# Patient Record
Sex: Male | Born: 1965 | Race: White | Hispanic: No | Marital: Married | Smoking: Never smoker
Health system: Southern US, Community
[De-identification: ages and names within clinical notes are randomized; demographics above are authoritative.]

## PROBLEM LIST (undated history)

## (undated) DIAGNOSIS — N200 Calculus of kidney: Secondary | ICD-10-CM

## (undated) HISTORY — PX: APPENDECTOMY: SHX54

---

## 2008-10-31 ENCOUNTER — Encounter (INDEPENDENT_AMBULATORY_CARE_PROVIDER_SITE_OTHER): Payer: Self-pay | Admitting: General Surgery

## 2008-11-01 ENCOUNTER — Inpatient Hospital Stay (HOSPITAL_COMMUNITY): Admission: EM | Admit: 2008-11-01 | Discharge: 2008-11-04 | Payer: Self-pay | Admitting: Emergency Medicine

## 2008-11-12 ENCOUNTER — Inpatient Hospital Stay (HOSPITAL_COMMUNITY): Admission: EM | Admit: 2008-11-12 | Discharge: 2008-11-15 | Payer: Self-pay | Admitting: Emergency Medicine

## 2008-11-13 ENCOUNTER — Ambulatory Visit (HOSPITAL_COMMUNITY): Admission: RE | Admit: 2008-11-13 | Discharge: 2008-11-13 | Payer: Self-pay | Admitting: General Surgery

## 2009-08-06 ENCOUNTER — Observation Stay (HOSPITAL_COMMUNITY): Admission: EM | Admit: 2009-08-06 | Discharge: 2009-08-07 | Payer: Self-pay | Admitting: Emergency Medicine

## 2010-04-19 IMAGING — US US SCROTUM
1 series · 14 of 25 positions shown · non-contrast
Comparison: None.

CLINICAL DATA: Right scrotal pain

SCROTAL ULTRASOUND
DOPPLER ULTRASOUND OF THE TESTICLES
TECHNIQUE: Complete ultrasound examination of the testicles,
epididymis, and other scrotal structures was performed.  Color and
spectral Doppler ultrasound were also utilized to evaluate blood
flow to the testicles.

[Series 1: unknown · 0.09mm/px · 14 of 42 slices shown]
[im 1/42]
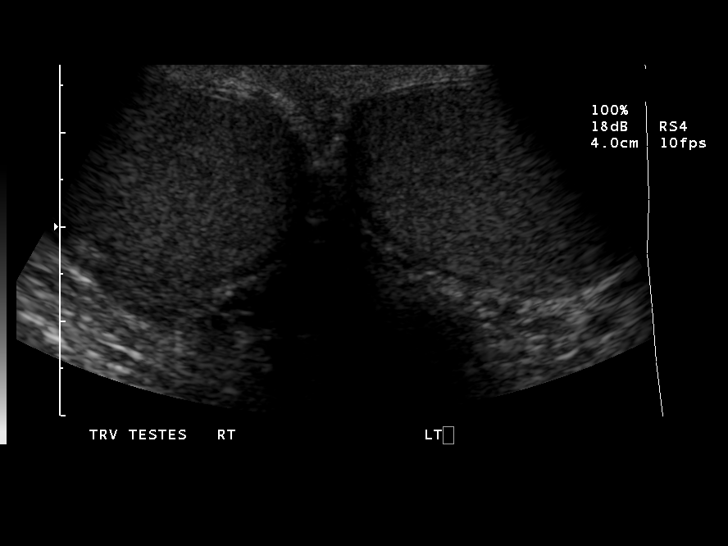
[im 4/42]
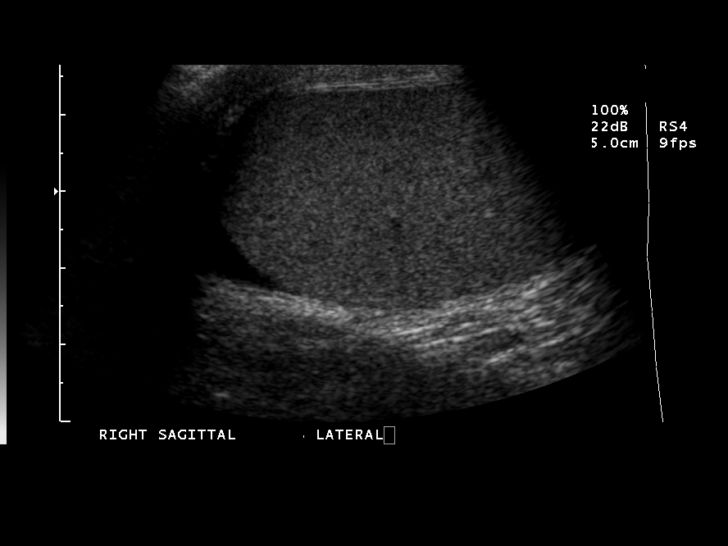
[im 7/42]
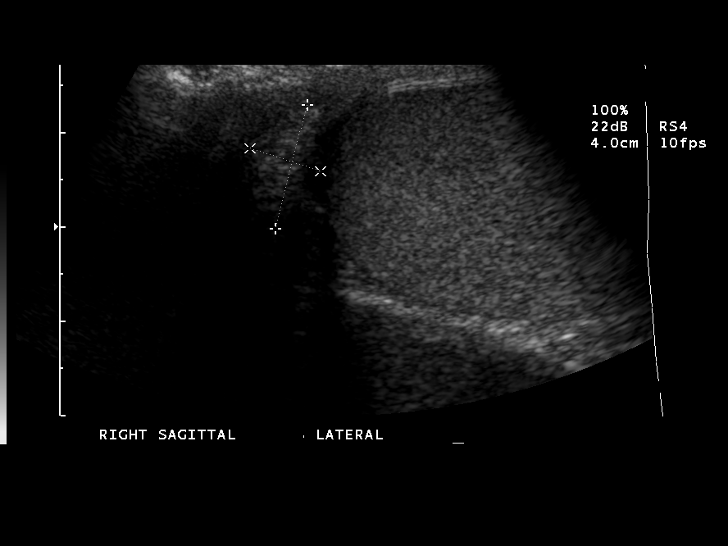
[im 11/42]
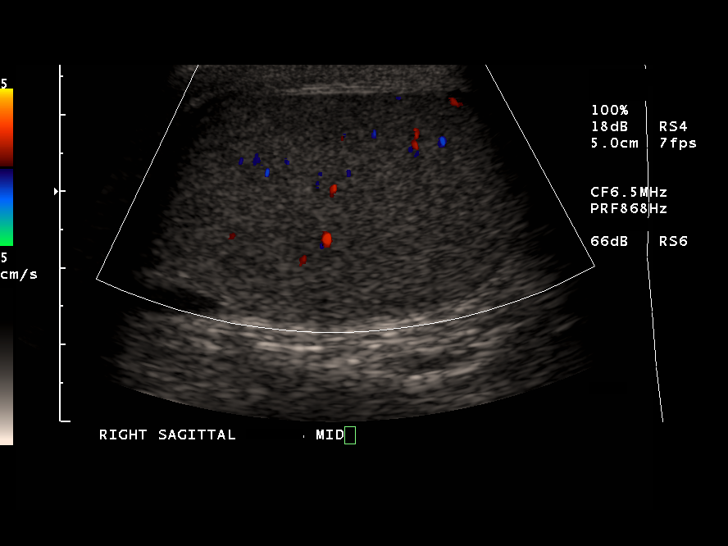
[im 14/42]
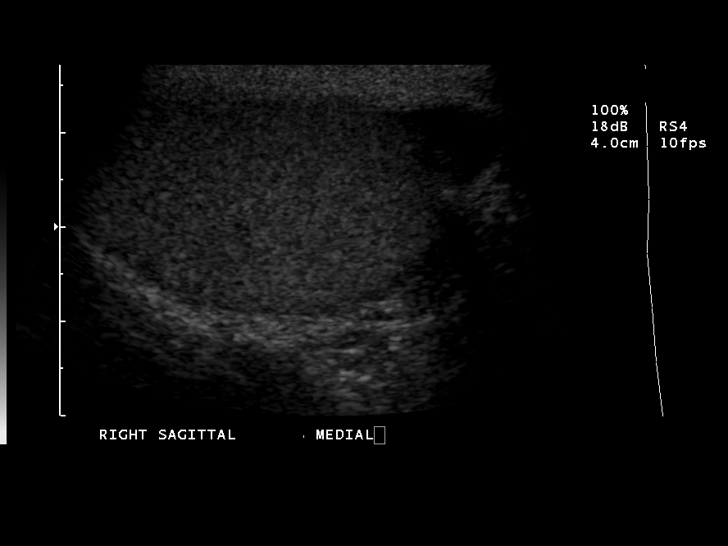
[im 16/42]
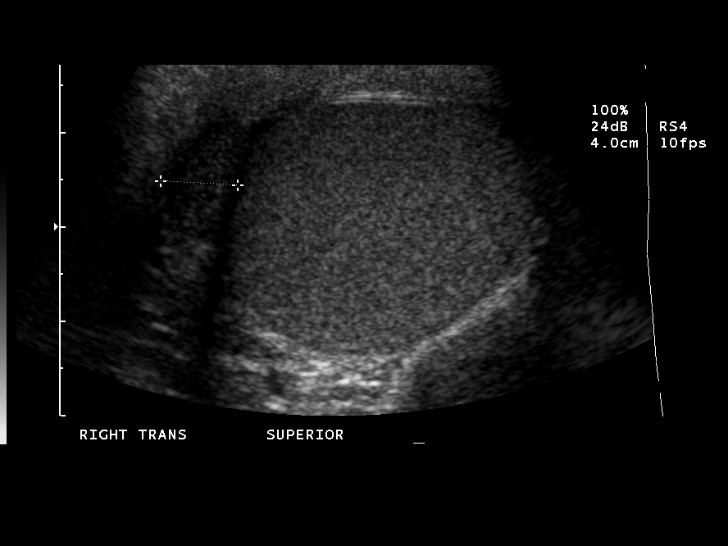
[im 19/42]
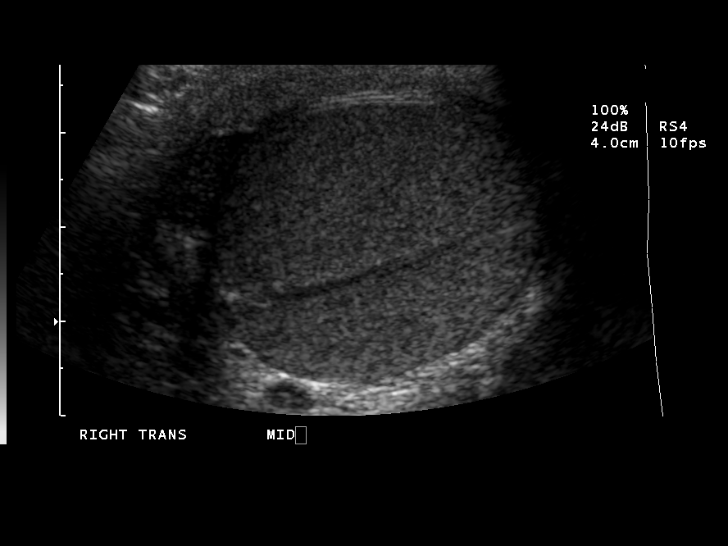
[im 23/42]
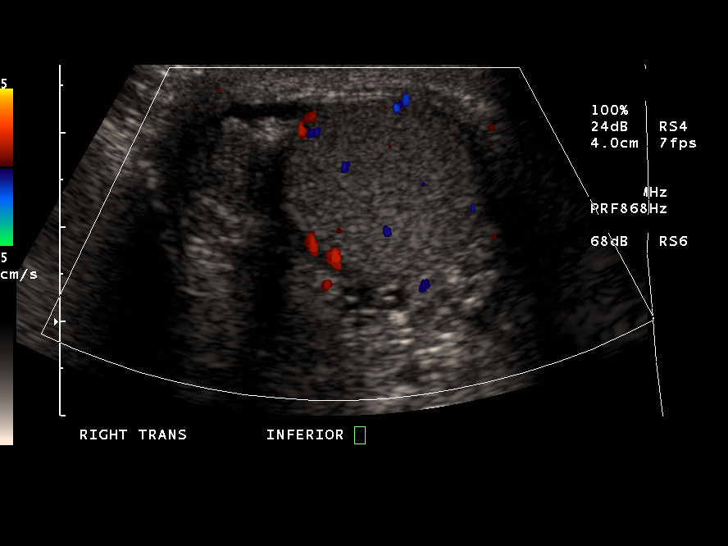
[im 26/42]
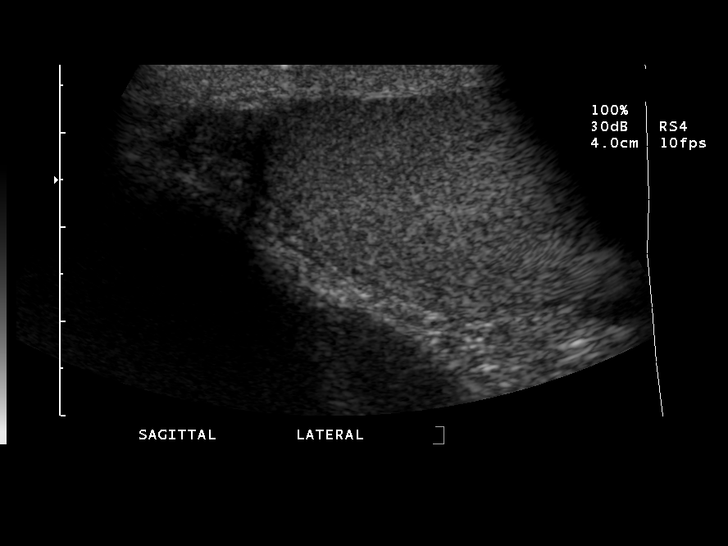
[im 28/42]
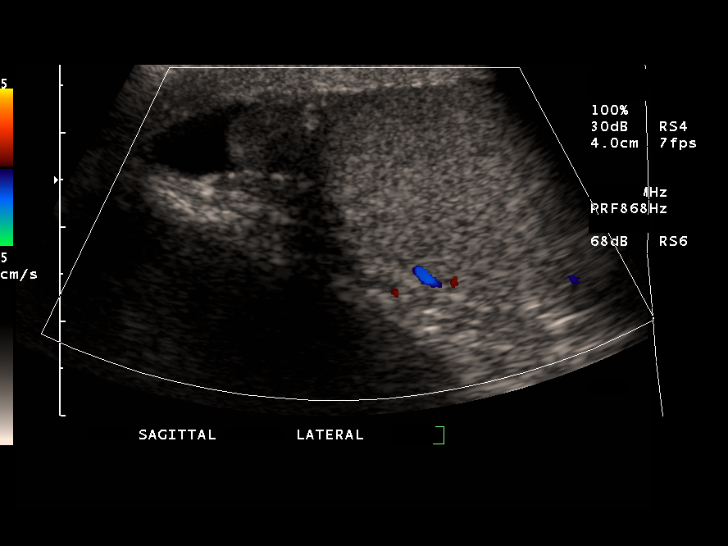
[im 31/42]
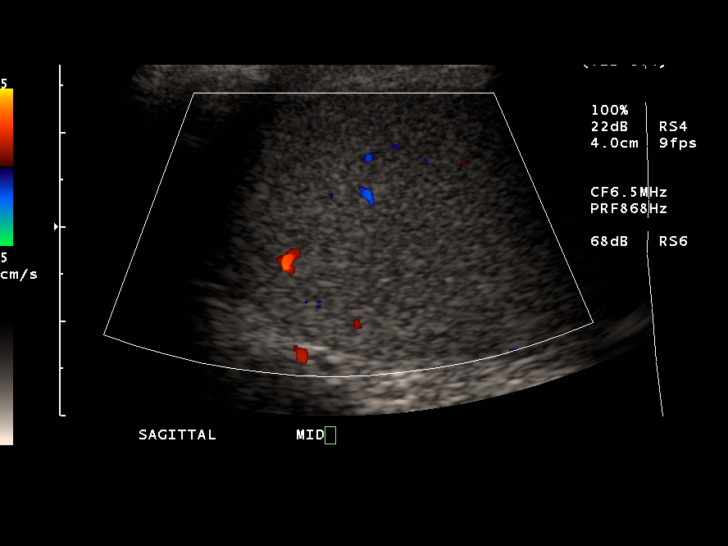
[im 35/42]
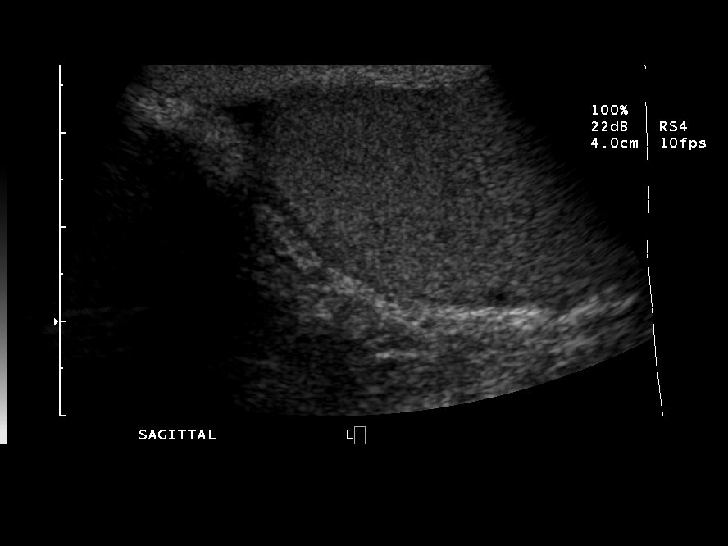
[im 38/42]
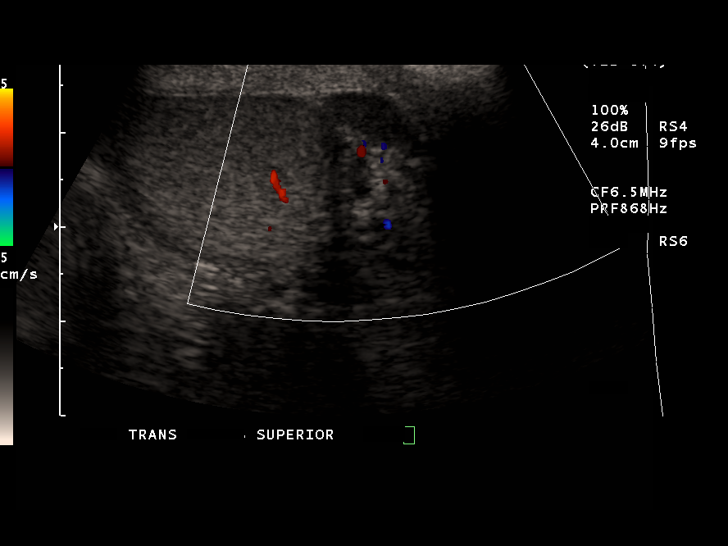
[im 42/42]
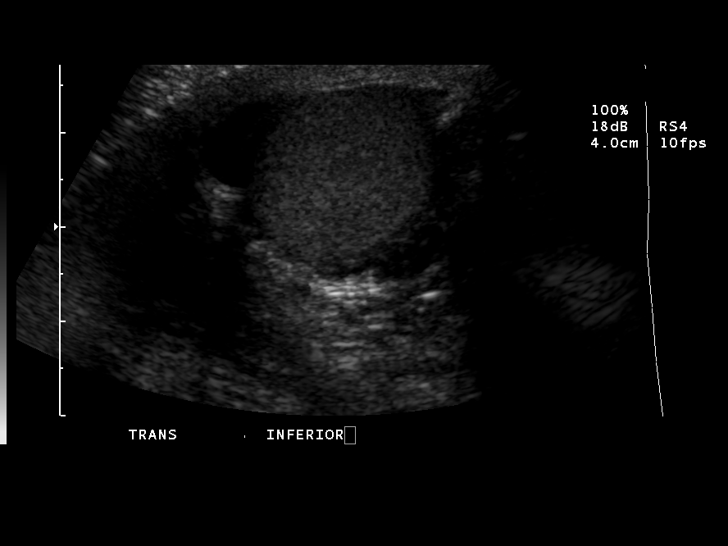

[14 of 25 positions shown; findings below may reference images not displayed]

FINDINGS: Right testis 5.1 x 2.9 x 3.6 cm.
Left testis 4.7 x 2.8 x 3.5 cm.
Both testes are normal in appearance, homogeneous in echogenicity,
symmetric.
No intratesticular mass, calcification or cyst.
Minimal asymmetry of epididymi, slightly larger on left, without
focal masses.
Tiny left hydrocele.
No varicocele or inguinal hernia identified.
Symmetric blood flow is seen within epididymi and testes on color
Doppler imaging.
Arterial and venous waveforms present both testes on pulse wave
Doppler.
No evidence of testicular torsion or hyperemia.
IMPRESSION: Tiny left hydrocele.
Otherwise normal exam.

## 2010-04-19 IMAGING — CT CT ABDOMEN W/O CM
2 of 4 series · 16 of 46 positions shown, 18 images · non-contrast
Comparison: 11/12/2008.

CT ABDOMEN

CLINICAL DATA: Right flank pain.  Appendectomy.

CT ABDOMEN AND PELVIS WITHOUT CONTRAST
TECHNIQUE: Multidetector CT imaging of the abdomen and pelvis was
performed following the standard protocol without intravenous
contrast.

[Series 2: standard/full over (age)lbs 5.0 · axial · 0.68mm/px · z∈[-574,-164]mm · 13 of 90 slices shown, 15 images]
[im 4/90  soft-tissue]
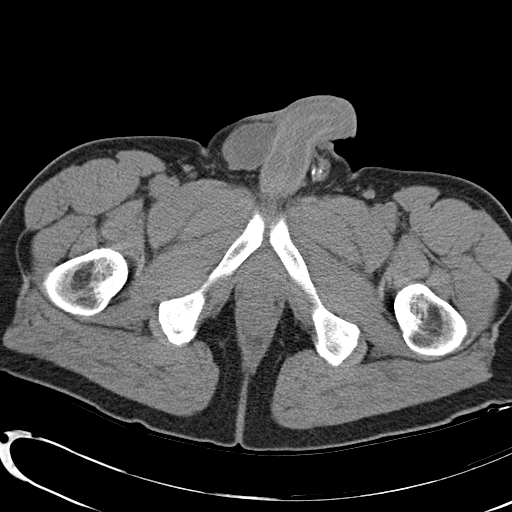
[im 4/90  bone]
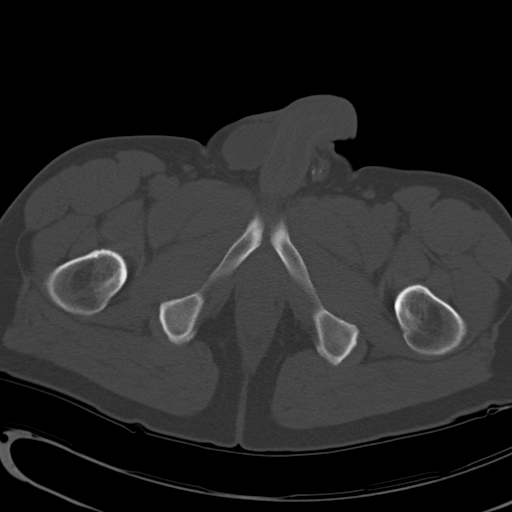
[im 11/90  soft-tissue]
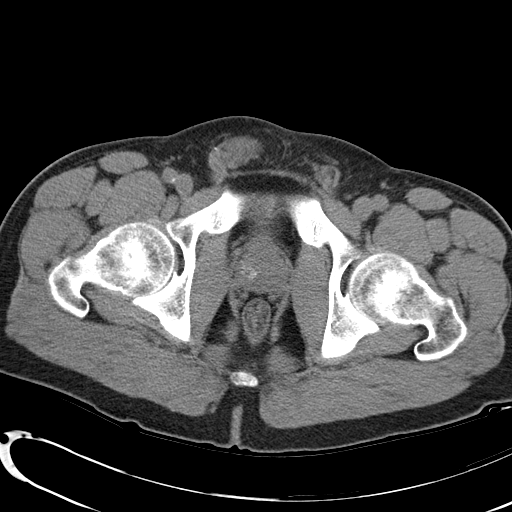
[im 18/90  soft-tissue]
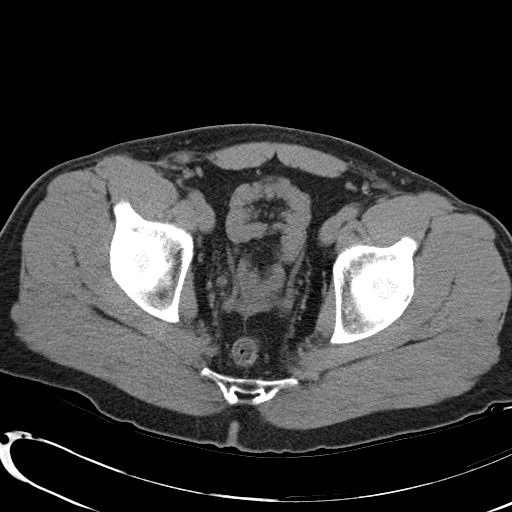
[im 25/90  soft-tissue]
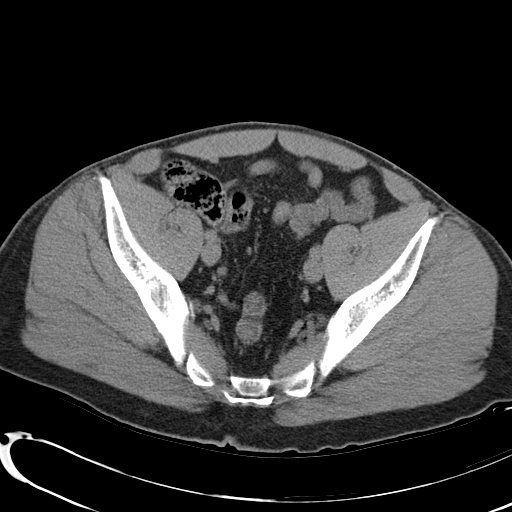
[im 33/90  soft-tissue]
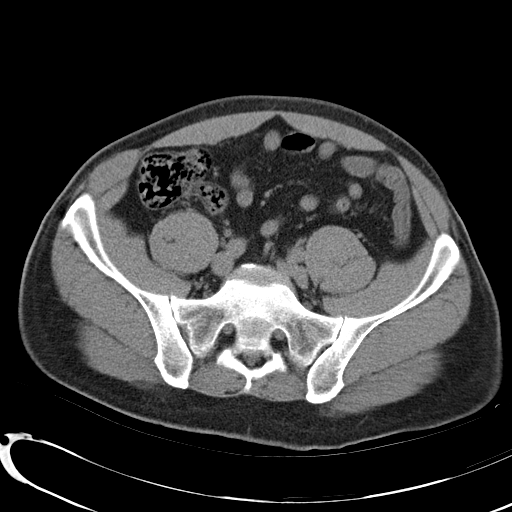
[im 40/90  soft-tissue]
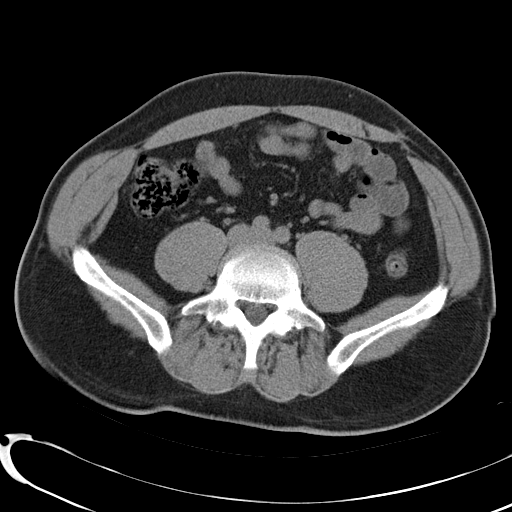
[im 47/90  soft-tissue]
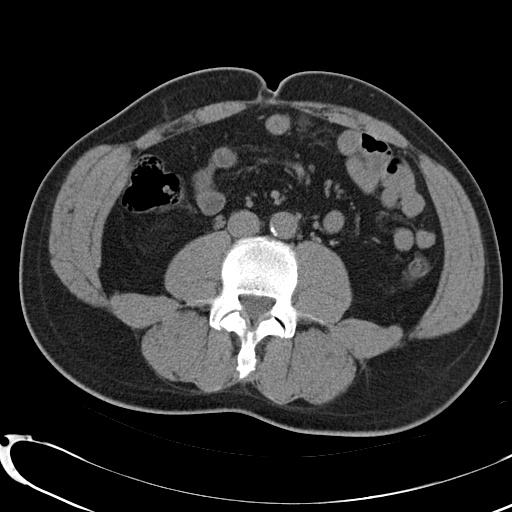
[im 50/90  soft-tissue]
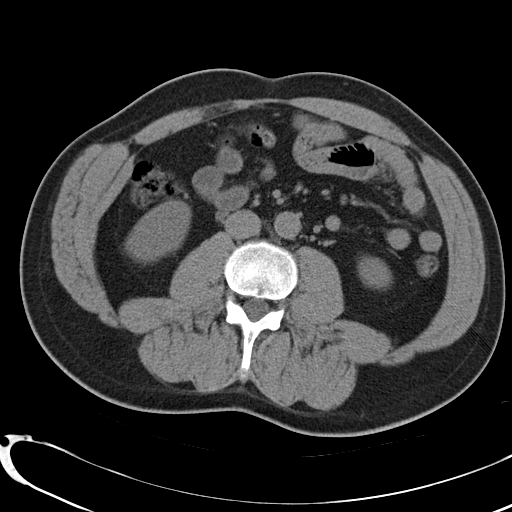
[im 57/90  soft-tissue]
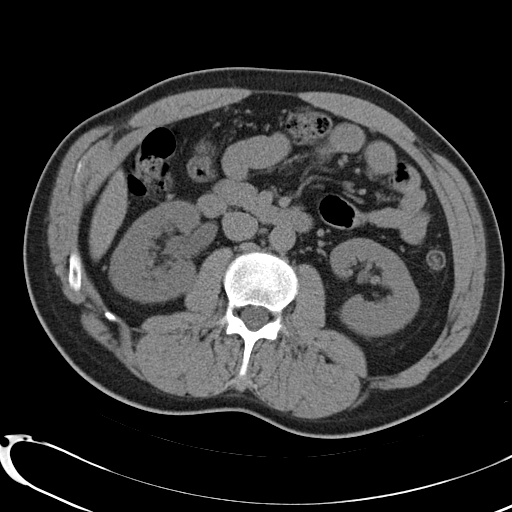
[im 57/90  bone]
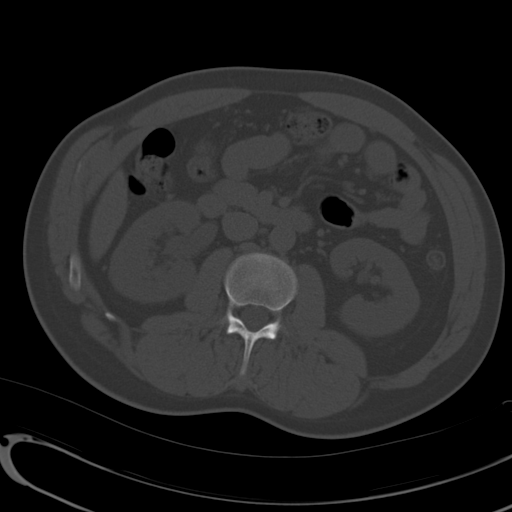
[im 65/90  soft-tissue]
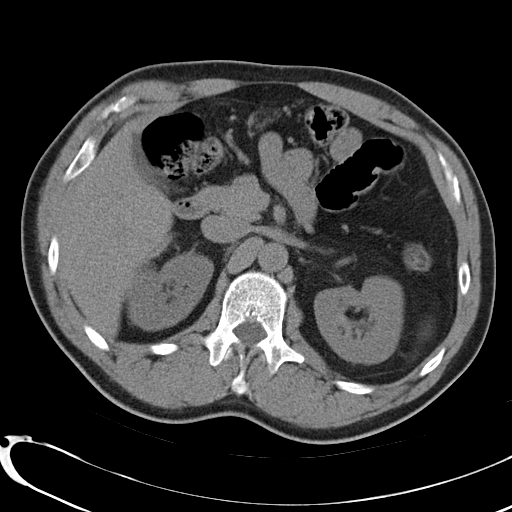
[im 72/90  soft-tissue]
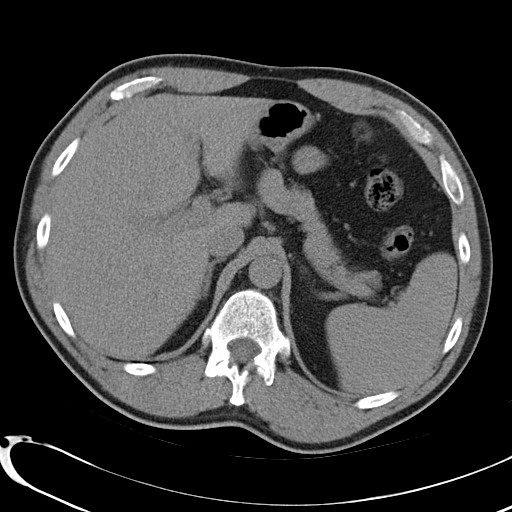
[im 79/90  soft-tissue]
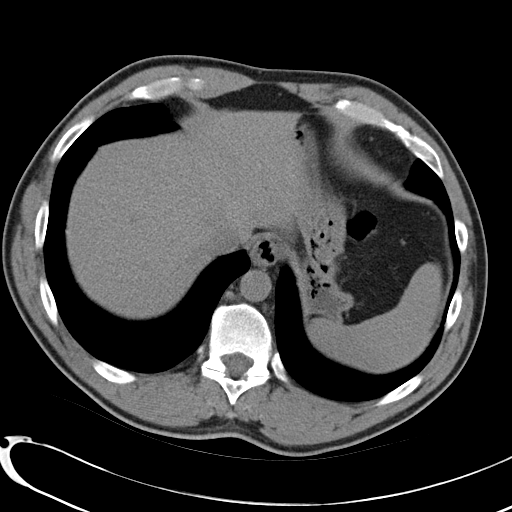
[im 86/90  soft-tissue]
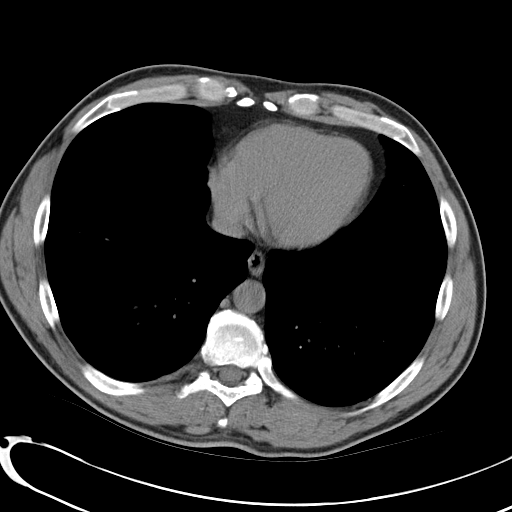

[Series 4: mpr coronal · coronal · 0.66mm/px · 3 of 70 slices shown]
[im 24/70  soft-tissue]
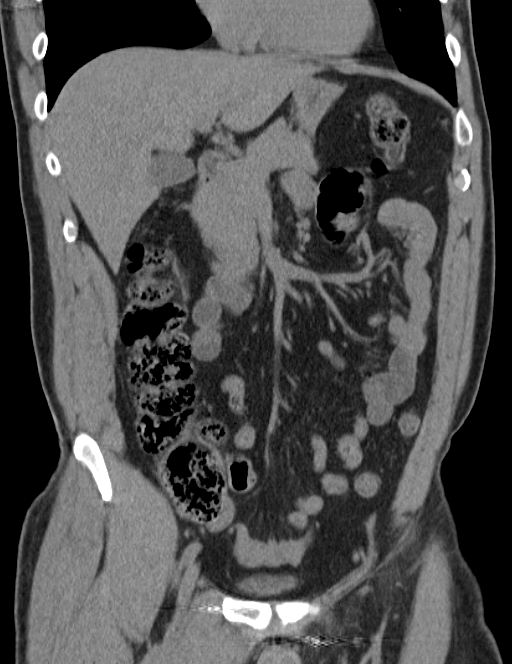
[im 31/70  soft-tissue]
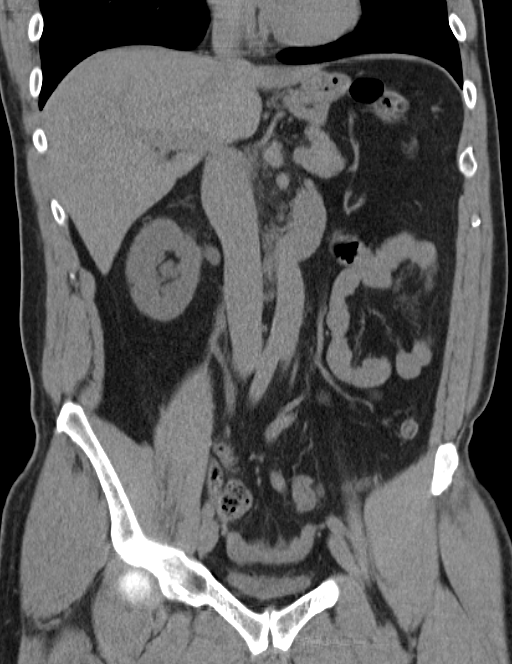
[im 39/70  soft-tissue]
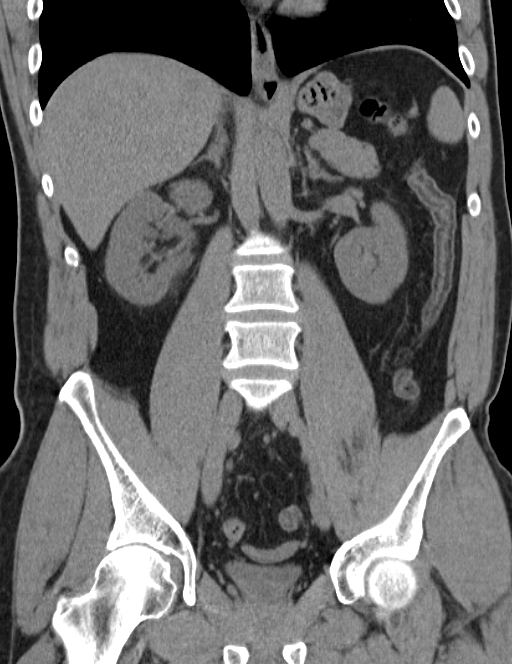

[16 of 46 positions shown; findings below may reference images not displayed]

FINDINGS: Mild   hydronephrosis on the right due to a distal right
ureteral calculus.  The right ureter is diffusely dilated.  There
is a nonobstructing 4 mm stone in the right upper pole.  Negative
for calculi or obstruction on the left.  Negative for renal mass.

There is no mass or adenopathy.  The bowel is normal.
IMPRESSION: Obstruction of the right kidney by a distal right ureteral stone.
There is also a nonobstructing stone in the right upper pole.

CT PELVIS
FINDINGS: 2 x  4 mm stone in the distal right ureter causing
obstruction of the right ureter.  This is very near the UVJ.  No
bladder calculi.  There are prostate calcifications.  There are
clips in the region of prior appendectomy.  There is no bowel
obstruction.  No free fluid.

There are clips in the right groin from prior hernia repair.  There
is a fluid collection in the right upper scrotum measuring 3.2 x
4.2 cm which is likely a hydrocele or lymphocele.
IMPRESSION: 2 x 4 mm calculus in the distal right ureter causing obstruction.

Right upper scrotal fluid collection which may be a lymphocele or
hydrocele.  Prior herniorrhaphy on the right.

## 2010-11-15 LAB — CBC
Platelets: 183 10*3/uL (ref 150–400)
RBC: 5.26 MIL/uL (ref 4.22–5.81)
WBC: 11.4 10*3/uL — ABNORMAL HIGH (ref 4.0–10.5)

## 2010-11-15 LAB — URINALYSIS, ROUTINE W REFLEX MICROSCOPIC
Glucose, UA: NEGATIVE mg/dL
Leukocytes, UA: NEGATIVE
Specific Gravity, Urine: 1.03 (ref 1.005–1.030)
Urobilinogen, UA: 0.2 mg/dL (ref 0.0–1.0)

## 2010-11-15 LAB — DIFFERENTIAL
Basophils Relative: 0 % (ref 0–1)
Lymphs Abs: 1.3 10*3/uL (ref 0.7–4.0)
Monocytes Relative: 5 % (ref 3–12)
Neutro Abs: 9.4 10*3/uL — ABNORMAL HIGH (ref 1.7–7.7)
Neutrophils Relative %: 83 % — ABNORMAL HIGH (ref 43–77)

## 2010-11-15 LAB — BASIC METABOLIC PANEL
Calcium: 9.1 mg/dL (ref 8.4–10.5)
Creatinine, Ser: 1.06 mg/dL (ref 0.4–1.5)
GFR calc non Af Amer: >60 mL/min (ref >60)
Sodium: 139 mEq/L (ref 135–145)

## 2010-11-15 LAB — URINE CULTURE: Culture: NO GROWTH

## 2010-11-15 LAB — URINE MICROSCOPIC-ADD ON

## 2010-11-24 LAB — DIFFERENTIAL
Basophils Absolute: 0 10*3/uL (ref 0.0–0.1)
Basophils Absolute: 0.2 10*3/uL — ABNORMAL HIGH (ref 0.0–0.1)
Basophils Relative: 0 % (ref 0–1)
Basophils Relative: 2 % — ABNORMAL HIGH (ref 0–1)
Eosinophils Absolute: 0 10*3/uL (ref 0.0–0.7)
Eosinophils Relative: 0 % (ref 0–5)
Lymphocytes Relative: 14 % (ref 12–46)
Monocytes Absolute: 0.5 10*3/uL (ref 0.1–1.0)
Monocytes Absolute: 1.1 10*3/uL — ABNORMAL HIGH (ref 0.1–1.0)
Monocytes Relative: 3 % (ref 3–12)
Neutro Abs: 11.7 10*3/uL — ABNORMAL HIGH (ref 1.7–7.7)
Neutro Abs: 8 10*3/uL — ABNORMAL HIGH (ref 1.7–7.7)
Neutrophils Relative %: 74 % (ref 43–77)

## 2010-11-24 LAB — CULTURE, ROUTINE-ABSCESS

## 2010-11-24 LAB — CBC
Hemoglobin: 13 g/dL (ref 13.0–17.0)
MCHC: 34 g/dL (ref 30.0–36.0)
MCHC: 34.8 g/dL (ref 30.0–36.0)
MCV: 94.6 fL (ref 78.0–100.0)
Platelets: 518 10*3/uL — ABNORMAL HIGH (ref 150–400)
RBC: 3.94 MIL/uL — ABNORMAL LOW (ref 4.22–5.81)
RDW: 12 % (ref 11.5–15.5)
RDW: 12.2 % (ref 11.5–15.5)

## 2010-11-24 LAB — ANAEROBIC CULTURE

## 2010-11-25 LAB — DIFFERENTIAL
Basophils Absolute: 0 10*3/uL (ref 0.0–0.1)
Basophils Absolute: 0.2 10*3/uL — ABNORMAL HIGH (ref 0.0–0.1)
Basophils Relative: 0 % (ref 0–1)
Basophils Relative: 0 % (ref 0–1)
Basophils Relative: 1 % (ref 0–1)
Eosinophils Absolute: 0 10*3/uL (ref 0.0–0.7)
Eosinophils Absolute: 0 10*3/uL (ref 0.0–0.7)
Eosinophils Absolute: 0 10*3/uL (ref 0.0–0.7)
Eosinophils Relative: 0 % (ref 0–5)
Eosinophils Relative: 0 % (ref 0–5)
Lymphocytes Relative: 1 % — ABNORMAL LOW (ref 12–46)
Lymphocytes Relative: 5 % — ABNORMAL LOW (ref 12–46)
Lymphocytes Relative: 6 % — ABNORMAL LOW (ref 12–46)
Lymphocytes Relative: 7 % — ABNORMAL LOW (ref 12–46)
Lymphs Abs: 0.3 10*3/uL — ABNORMAL LOW (ref 0.7–4.0)
Lymphs Abs: 0.4 10*3/uL — ABNORMAL LOW (ref 0.7–4.0)
Lymphs Abs: 0.6 10*3/uL — ABNORMAL LOW (ref 0.7–4.0)
Monocytes Absolute: 0.6 10*3/uL (ref 0.1–1.0)
Monocytes Relative: 1 % — ABNORMAL LOW (ref 3–12)
Monocytes Relative: 1 % — ABNORMAL LOW (ref 3–12)
Monocytes Relative: 2 % — ABNORMAL LOW (ref 3–12)
Monocytes Relative: 7 % (ref 3–12)
Monocytes Relative: 8 % (ref 3–12)
Neutro Abs: 12.6 10*3/uL — ABNORMAL HIGH (ref 1.7–7.7)
Neutro Abs: 6 10*3/uL (ref 1.7–7.7)
Neutro Abs: 8.9 10*3/uL — ABNORMAL HIGH (ref 1.7–7.7)
Neutrophils Relative %: 82 % — ABNORMAL HIGH (ref 43–77)
Neutrophils Relative %: 83 % — ABNORMAL HIGH (ref 43–77)
Neutrophils Relative %: 93 % — ABNORMAL HIGH (ref 43–77)
Neutrophils Relative %: 94 % — ABNORMAL HIGH (ref 43–77)
Neutrophils Relative %: 98 % — ABNORMAL HIGH (ref 43–77)
WBC Morphology: INCREASED
WBC Morphology: INCREASED

## 2010-11-25 LAB — URINALYSIS, ROUTINE W REFLEX MICROSCOPIC
Bilirubin Urine: NEGATIVE
Nitrite: NEGATIVE
Specific Gravity, Urine: 1.025 (ref 1.005–1.030)
Urobilinogen, UA: 0.2 mg/dL (ref 0.0–1.0)
pH: 5 (ref 5.0–8.0)

## 2010-11-25 LAB — CBC
HCT: 36.8 % — ABNORMAL LOW (ref 39.0–52.0)
HCT: 37.1 % — ABNORMAL LOW (ref 39.0–52.0)
HCT: 39.3 % (ref 39.0–52.0)
HCT: 42.9 % (ref 39.0–52.0)
Hemoglobin: 13 g/dL (ref 13.0–17.0)
Hemoglobin: 13.2 g/dL (ref 13.0–17.0)
Hemoglobin: 13.4 g/dL (ref 13.0–17.0)
MCHC: 33.8 g/dL (ref 30.0–36.0)
MCHC: 34.2 g/dL (ref 30.0–36.0)
MCHC: 35.3 g/dL (ref 30.0–36.0)
MCV: 94.6 fL (ref 78.0–100.0)
MCV: 95.6 fL (ref 78.0–100.0)
MCV: 97.2 fL (ref 78.0–100.0)
Platelets: 101 10*3/uL — ABNORMAL LOW (ref 150–400)
Platelets: 111 10*3/uL — ABNORMAL LOW (ref 150–400)
Platelets: 554 10*3/uL — ABNORMAL HIGH (ref 150–400)
Platelets: 95 10*3/uL — ABNORMAL LOW (ref 150–400)
RBC: 3.85 MIL/uL — ABNORMAL LOW (ref 4.22–5.81)
RBC: 4 MIL/uL — ABNORMAL LOW (ref 4.22–5.81)
RBC: 5.21 MIL/uL (ref 4.22–5.81)
RDW: 12.5 % (ref 11.5–15.5)
RDW: 12.7 % (ref 11.5–15.5)
RDW: 12.8 % (ref 11.5–15.5)
WBC: 14.1 10*3/uL — ABNORMAL HIGH (ref 4.0–10.5)
WBC: 15.4 10*3/uL — ABNORMAL HIGH (ref 4.0–10.5)
WBC: 7.3 10*3/uL (ref 4.0–10.5)
WBC: 9.6 10*3/uL (ref 4.0–10.5)
WBC: 9.6 10*3/uL (ref 4.0–10.5)

## 2010-11-25 LAB — COMPREHENSIVE METABOLIC PANEL
ALT: 27 U/L (ref 0–53)
AST: 36 U/L (ref 0–37)
AST: 37 U/L (ref 0–37)
Albumin: 2.4 g/dL — ABNORMAL LOW (ref 3.5–5.2)
Albumin: 2.6 g/dL — ABNORMAL LOW (ref 3.5–5.2)
Alkaline Phosphatase: 46 U/L (ref 39–117)
BUN: 14 mg/dL (ref 6–23)
BUN: 14 mg/dL (ref 6–23)
BUN: 31 mg/dL — ABNORMAL HIGH (ref 6–23)
CO2: 24 mEq/L (ref 19–32)
CO2: 28 mEq/L (ref 19–32)
Calcium: 8.1 mg/dL — ABNORMAL LOW (ref 8.4–10.5)
Calcium: 8.2 mg/dL — ABNORMAL LOW (ref 8.4–10.5)
Calcium: 9 mg/dL (ref 8.4–10.5)
Chloride: 105 mEq/L (ref 96–112)
Chloride: 105 mEq/L (ref 96–112)
Creatinine, Ser: 0.71 mg/dL (ref 0.4–1.5)
Creatinine, Ser: 1.89 mg/dL — ABNORMAL HIGH (ref 0.4–1.5)
GFR calc Af Amer: 47 mL/min — ABNORMAL LOW (ref >60)
GFR calc Af Amer: >60 mL/min (ref >60)
GFR calc non Af Amer: >60 mL/min (ref >60)
Glucose, Bld: 100 mg/dL — ABNORMAL HIGH (ref 70–99)
Glucose, Bld: 120 mg/dL — ABNORMAL HIGH (ref 70–99)
Glucose, Bld: 156 mg/dL — ABNORMAL HIGH (ref 70–99)
Potassium: 3.3 mEq/L — ABNORMAL LOW (ref 3.5–5.1)
Potassium: 3.4 mEq/L — ABNORMAL LOW (ref 3.5–5.1)
Sodium: 137 mEq/L (ref 135–145)
Sodium: 137 mEq/L (ref 135–145)
Total Bilirubin: 1.3 mg/dL — ABNORMAL HIGH (ref 0.3–1.2)
Total Bilirubin: 1.5 mg/dL — ABNORMAL HIGH (ref 0.3–1.2)
Total Protein: 5.5 g/dL — ABNORMAL LOW (ref 6.0–8.3)
Total Protein: 6.9 g/dL (ref 6.0–8.3)

## 2010-11-25 LAB — BASIC METABOLIC PANEL
BUN: 16 mg/dL (ref 6–23)
BUN: 25 mg/dL — ABNORMAL HIGH (ref 6–23)
CO2: 26 mEq/L (ref 19–32)
Chloride: 103 mEq/L (ref 96–112)
Chloride: 96 mEq/L (ref 96–112)
GFR calc Af Amer: >60 mL/min (ref >60)
GFR calc non Af Amer: >60 mL/min (ref >60)
GFR calc non Af Amer: >60 mL/min (ref >60)
Glucose, Bld: 112 mg/dL — ABNORMAL HIGH (ref 70–99)
Potassium: 4 mEq/L (ref 3.5–5.1)
Potassium: 4.5 mEq/L (ref 3.5–5.1)
Sodium: 135 mEq/L (ref 135–145)
Sodium: 135 mEq/L (ref 135–145)

## 2010-11-25 LAB — POTASSIUM
Potassium: 4.6 mEq/L (ref 3.5–5.1)
Potassium: 5.6 mEq/L — ABNORMAL HIGH (ref 3.5–5.1)

## 2010-12-28 NOTE — Discharge Summary (Signed)
NAME:  Albert Mcbride, Albert Mcbride NO.:  [id number]   MEDICAL RECORD NO.:  [id number]         PATIENT TYPE:  PINP   LOCATION:  A321                          FACILITY:  [facility]   PHYSICIAN:  Dalia Heading, M.D.  DATE OF BIRTH:  28-Nov-1965   DATE OF ADMISSION:  11/12/2008  DATE OF DISCHARGE:  04/03/2010LH                               DISCHARGE SUMMARY   HOSPITAL COURSE SUMMARY:  The patient is a 45 year old white male status  post laparoscopic cholecystectomy on November 01, 2008, who presented with  a 24-hour history of worsening pelvic pressure and urinary frequency.  CT scan of the abdomen and pelvis revealed a pelvic abscess.  His white  blood cell count was noted to be 15.4.  He was admitted to the hospital  for further evaluation and treatment.  He underwent CT-guided  percutaneous drainage of the pelvic abscess on November 13, 2008.  He  tolerated the procedure well.  His postprocedure course has been  unremarkable.  His white blood cell count has decreased to 10.9.  Multiple species of bacteria were noted on gram-stain, though initial  culture only reveals E. coli.  The patient is being discharged home in  good improving condition.   DISCHARGE INSTRUCTIONS:  The patient is to follow up with Dr. Leticia Penna on  November 18, 2008.   DISCHARGE MEDICATIONS:  Vicodin 1-2 tablets p.o. q.4 h. p.r.n. pain,  ciprofloxacin 500 mg p.o. b.i.d. x10 days, Flagyl 500 mg p.o. t.i.d. x10  days, Phenergan 25 mg 1 tablet p.o. q.6 h. p.r.n. nausea.   PRINCIPAL DIAGNOSES:  1. Pelvic abscess.  2. Status post laparoscopic appendectomy.   PRINCIPAL PROCEDURE:  CT-guided pelvic abscess drainage on November 13, 2008.      Dalia Heading, M.D.  Electronically Signed     MAJ/MEDQ  D:  11/15/2008  T:  11/15/2008  Job:  [id number]   cc:   Deanna Artis. Sharene Skeans, M.D.  Fax: [phone]

## 2010-12-28 NOTE — H&P (Signed)
NAMEMarland Kitchen  KRUZ, CHIU NO.:  [id number]   MEDICAL RECORD NO.:  [id number]          PATIENT TYPE:  INP   LOCATION:  A321                          FACILITY:  [facility]   PHYSICIAN:  Dalia Heading, M.D.  DATE OF BIRTH:  [birth year]   DATE OF ADMISSION:  11/12/2008  DATE OF DISCHARGE:  LH                              HISTORY & PHYSICAL   CHIEF COMPLAINT:  Pelvic abscess.   HISTORY OF PRESENT ILLNESS:  The patient is a 45 year old white male  status post a laparoscopic appendectomy for acute appendicitis by Dr.  Tilford Pillar on November 01, 2008, who presents with 24-hour history of  worsening pelvic pressure and urinary frequency.  He presented to the  emergency room for further evaluation and treatment.  CT scan of the  abdomen and pelvis reveals a pelvic abscess.   PAST MEDICAL HISTORY:  Nephrolithiasis.   PAST SURGICAL HISTORY:  As noted above, excision of a neuroma in the  past.   CURRENT MEDICATIONS:  None.   ALLERGIES:  PENICILLIN.   REVIEW OF SYSTEMS:  The patient denies drinking or smoking.  He denies  any other cardiopulmonary difficulties or bleeding disorders.   PHYSICAL EXAMINATION:  GENERAL:  The patient is a well-developed, well-  nourished white male in no acute distress.  He is afebrile.  VITAL SIGNS:  Stable.  LUNGS:  Clear to auscultation with equal breath sounds bilaterally.  HEART:  Regular rate and rhythm without S3, S4, or murmurs.  ABDOMEN:  Soft with some fullness noted in the suprapubic region, though  no hepatosplenomegaly, masses, or rigidity are noted.  Well-healed  surgical scars are noted.   White blood cell count 15.4, hemoglobin 13.4, hematocrit 39.3, platelet  count 554.  MET-7 is within normal limits.   IMPRESSION:  Pelvic abscess, status post laparoscopic appendectomy.   PLAN:  The patient will be admitted to hospital and started on Tygacil  as he is allergic to penicillin.  He subsequently will undergo CT-guided  percutaneous drainage of the pelvic abscess at the [facility] in the  a.m.  He will be transferred. There will be a [facility].  The results of  the CAT scan and the treatment plan were discussed with the patient.  He  agrees to the treatment plan.      Dalia Heading, M.D.  Electronically Signed     MAJ/MEDQ  D:  11/12/2008  T:  11/13/2008  Job:  [id number]   cc:   Deanna Artis. Sharene Skeans, M.D.  Fax: [phone]

## 2010-12-28 NOTE — H&P (Signed)
NAMEMarland Mcbride  SABIEN, UMLAND NO.:  [id number]   MEDICAL RECORD NO.:  [id number]          PATIENT TYPE:  OBV   LOCATION:  A301                          FACILITY:  [facility]   PHYSICIAN:  Tilford Pillar, MD      DATE OF BIRTH:  01-26-1966   DATE OF ADMISSION:  10/31/2008  DATE OF DISCHARGE:  LH                              HISTORY & PHYSICAL   CHIEF COMPLAINT:  Right lower quadrant abdominal pain.   HISTORY OF PRESENT ILLNESS:  The patient is a 45 year old male otherwise  healthy who presents with approximately 24 hours of right lower quadrant  abdominal pain.  This started acutely last p.m., it was localized to the  right lower quadrant and describes as a sharp constant pain.  There is  associated nausea and vomiting, nonbloody.  He has had associated  diarrhea nonbloody.  No melena and no hematochezia.  No dysuria.  He has  had positive fevers up to 102 degrees Fahrenheit at home and throughout  the day, he has had a decrease in appetite, although he states he is  extremely thirsty.  He has not been able to keep any oral liquids down  over the last 24 hours.  He has had no similar symptomatology in the  past, although he has had flank pain in the past associated with renal  calculi which he initially thought this was; however, he has never had  any pain that has progressed, and been constant like this.  He has had  no recent sick contacts.  No unusual travel.   PAST MEDICAL HISTORY:  Renal calculi.   SURGICAL HISTORY:  He has had an excision of a neuroma   MEDICATIONS:  None.   ALLERGIES:  PENICILLIN which causes swelling and edema.   SOCIAL HISTORY:  No tobacco, no alcohol, no recreational drug use.  He  works as a Curator.   FAMILY HISTORY:  There is no family history of any inflammatory bowel  processes.  No history of early onset colon cancers.  No significant  contributable family medical history.   REVIEW OF SYSTEMS:  Essentially unremarkable other than for  genitourinary which he did state some dark urine over the last 24 hours,  otherwise all other systems are unremarkable including constitutional,  eyes, ears, nose and throat, respiratory, cardiovascular,  musculoskeletal, skin neuro, endocrine.   PHYSICAL EXAMINATION:  VITAL SIGNS:  Temperature 97.9, heart rate 103,  respirations 18, blood pressure 101/55.  He is 96% O2 saturation on room  air. GENERAL:  He is not in any acute distress, but does appear  comfortably he is lying in a supine position in the emergency room cot.  His parents are present at the bedside.  HEENT:  Scalp, no deformities.  Extraocular movements are intact.  His  oral mucosa is pink.  Normal occlusion.  NECK:  Trachea is midline.  No cervical lymphadenopathy.  PULMONARY:  Unlabored respirations.  He is clear to auscultation  bilaterally.  CARDIOVASCULAR:  Tachycardiac, but regular.  No murmurs or gallops are  apparent.  He has 2+ radial and dorsalis pedis pulses  bilaterally.  No  peripheral edema is noted.  ABDOMEN:  He does have diminished bowel sounds.  Abdomen is flat.  He  does have positive right lower quadrant abdominal pain.  At McBurney  point, there is clear guarding in this area.  He has no Rovsing sign.  He has no exacerbation with psoas sign or heel tap.  No masses or  hernias are apparent.  EXTREMITIES:  Warm and dry.   PERTINENT LABORATORY AND RADIOGRAPHIC STUDIES:  CBC, white blood cell  count 26.0, hemoglobin 17.3, hematocrit 50.7, platelets 129, neutrophils  elevated 98, lymphocytes 1, monocytes 1.  Basic metabolic panel, sodium  was 137.  Initial potassium on admission was 5.5, chloride was 101,  bicarb is 24, BUN is 37, creatinine is 1.89, blood glucose was elevated  at 156.  He did have a repeat potassium which decreased to 4.6 after  fluid administration.  CT of the abdomen and pelvis was noncontrast,  study did demonstrate no evidence of any free air.  There is positive  inflammatory  changes in the pericecal periappendiceal region.  Study is  somewhat limited due to the lack of contrast; however, this suggestive  of acute appendicitis.   ASSESSMENT AND PLAN:  Acute appendicitis.  At this point the risks,  benefits, alternatives of a laparoscopic possible open appendectomy were  discussed at length with the patient and the patient's parents.  At this  time, I will continue to keep him on an n.p.o. status, continue IV  fluid, and again we will plan to proceed after the potassium is  rechecked.  We will continue him on DVT prophylaxis and we will continue  IV antibiotics with Mefoxin as the patient has never had problems with a  cephalosporin  and at this time, we will take him to the operating room  for emergent appendectomy.      Tilford Pillar, MD  Electronically Signed     BZ/MEDQ  D:  10/31/2008  T:  11/01/2008  Job:  [id number]

## 2010-12-28 NOTE — Op Note (Signed)
NAMEMarland Mcbride  AWAB, ABEBE NO.:  [id number]   MEDICAL RECORD NO.:  [id number]          PATIENT TYPE:  INP   LOCATION:  A301                          FACILITY:  [facility]   PHYSICIAN:  Tilford Pillar, MD      DATE OF BIRTH:  13-Feb-1966   DATE OF PROCEDURE:  11/01/2008  DATE OF DISCHARGE:                               OPERATIVE REPORT   PREOPERATIVE DIAGNOSIS:  Acute appendicitis.   POSTOPERATIVE DIAGNOSIS:  Acute appendicitis.   PROCEDURE:  Laparoscopic appendectomy.   SURGEON:  Tilford Pillar, MD   ANESTHESIA:  General endotracheal, local anesthetic 0.5% Sensorcaine  plain.   SPECIMEN:  Appendix.   ESTIMATED BLOOD LOSS:  Minimal.   INDICATION:  The patient is a 45 year old male with a history of renal  calculi who presented to the [facility] with worsening right  lower quadrant abdominal pain.  He had initially presented with thoughts  that this was an exacerbation of his renal calculi.  On workup, he was  discovered to have inflammatory changes around the cecum.  Surgical  consultation was obtained based on the patient's clinical presentation  and diagnostic workup, it was consistent with acute appendicitis.  The  risks, benefits, and alternatives of the laparoscopic possible open  appendectomy were discussed at length with the patient and the patient's  family including, but not limited to the risk of bleeding, infection,  anastomotic leak, possible ostomy, intraoperative cardiac and pulmonary  events.  The questions and concerns were addressed and the patient was  consented for the planned procedure.   OPERATION:  The patient was taken to the operating room, placed in  supine position on the operating table at which time the general  anesthetic was administered.  Once the patient was asleep, he was  endotracheally intubated by anesthesia.  At this time, the patient had a  Foley catheter placed in standard sterile fashion by myself and his  abdomen was  prepped with DuraPrep solution.  Drapes were placed in  standard fashion.  The stab incision was created supraumbilically with  11-blade scalpel.  Additional dissection down to the subcuticular tissue  was carried out using a Kocher clamp, which was utilized to grasp the  anterior abdominal fascia and moved this anteriorly and then a Veress  needle was inserted.  Once the Veress needle was inserted, saline drop  test was utilized to confirm intraperitoneal placement, and then a  pneumoperitoneum was initiated.  Once sufficient pneumoperitoneum was  obtained, an 11-mm trocar was inserted over the laparoscope allowing  visualization of the trocar entering into the peritoneal cavity.  At  this time, the inner cannula was removed.  The laparoscope was  reinserted.  There is no evidence of any trocar or Veress needle  placement injury.  At this time, the remaining trocars were placed with  a 5-mm trocar in the suprapubic region and an 11-mm in the left lateral  abdominal wall.  These were all placed under direct visualization with  the laparoscope.  At this time, the patient was placed through a  Trendelenburg left lateral decubitus position.  The  cecum was  identified.  There was significant inflammatory phlegmon down to the  pericecal region.  We continued to work forward to an inflammatory mass  which is near the junction of the terminal ileum and cecum.  Careful  dissection identified the tip of the appendix which was clearly inflamed  and enlarged was carefully dissected.  I opted to divide the  mesoappendix at this point with a ligature as the tissue was  significantly edematous and thickened.  I continued the dissection  towards the tip of the appendix upon identifying the base of the  appendix, I did create a window between the appendix and the  mesoappendix with a Art gallery manager.  A window was created behind the  base of the appendix with a Endo-GIA 45 regular load.  Once the space  of  the appendix was divided, I dissected with the remainder of the  mesoappendix with the LigaSure with excellent hemostasis.  With the  appendix free, I placed the appendix in the Endocatch bag and held it in  the right upper quadrant.  At this time, I did copiously irrigate the  surgical field.  No evidence of purulence, although there was  significant amount of purulence material in this area and cloudy serous  fluid.  No evidence of a clear rupture or perforation, but I did  copiously irrigate the surgical field.  I have reinspected the staple  line __________ staple line was intact.  Hemostasis was noted along the  divided mesoappendix and at this time, I turned my the attention to  closure.   Using a 2-0 Vicryl on a Endoclose suture passer, I passed the Vicryl  sutures with both 11-mm trocar sites.  These sutures in place.  The  appendix was removed in an intact EndoCatch bag through the umbilical  trocar site.  The appendix was placed in the back table and was sent as  a permanent specimen to pathology. At this time, the pneumoperitoneum  was evacuated.  The trocars were removed.  The Vicryl sutures were  secured.  The local anesthetic was instilled.  A 4-0 Monocryl was  utilized to reapproximate the skin edges at all 4 trocar sites.  The  skin was washed, dried with moist and dry towel.  Benzoin was applied  around the incision.  Half-inch Steri-Strips were placed.  The drapes  were removed.  The patient was allowed to come out of general  anesthetic.  During extubation, the patient continued to have some  laryngeal spasm which was adequately controlled by anesthesia, which did  slightly lengthen our time in the operating room.  The patient was  transferred to the postanesthetic care unit in stable condition.  At the  conclusion of the procedure, all instrument, sponge, and needle counts  were correct.  The patient tolerated the procedure extremely well.      Tilford Pillar,  MD  Electronically Signed     BZ/MEDQ  D:  11/01/2008  T:  11/02/2008  Job:  [id number]   cc:   Dr. Linna Hoff, [place]

## 2010-12-31 NOTE — Discharge Summary (Signed)
NAMEMarland Kitchen  VINEETH, FELL NO.:  [id number]   MEDICAL RECORD NO.:  [id number]          PATIENT TYPE:  INP   LOCATION:  A301                          FACILITY:  [facility]   PHYSICIAN:  Tilford Pillar, MD      DATE OF BIRTH:  29-Mar-1966   DATE OF ADMISSION:  10/31/2008  DATE OF DISCHARGE:  03/23/2010LH                               DISCHARGE SUMMARY   ADMISSION DIAGNOSIS:  Acute appendicitis.   DISCHARGE DIAGNOSES:  1. Acute appendicitis.  2. History of renal calculi.   PROCEDURE:  Laparoscopic appendectomy.   ADMITTING SURGEON:  Tilford Pillar, MD   DISPOSITION:  Home.   BRIEF HISTORY AND PHYSICAL:  Please see the admission history and  physical for the complete H and P.  The patient is a 45 year old male  who presented with approximately 24 hours of right lower quadrant  abdominal pain.  He was evaluated in the emergency department.  CT was  obtained which confirmed evidence of acute appendicitis.  He was  admitted for planned intervention and continued management.   HOSPITAL COURSE:  The patient was admitted.  He was taken to the  operating room, underwent a laparoscopic appendectomy which was  uneventful and spent a brief period in the postanesthetic care unit.  He  was then transferred back to a regular hospital floor.  Following, he  did have a symptomatology consistent with adynamic ileus with continued  symptoms of nausea and bloating.  He was therefore continued on IV  fluid, was encouraged to ambulate, and as he continued to have  improvement of his symptoms was eventually readvanced on fluids.  He did  have bowel function.  Upon having return of bowel function, he was  advanced on his diet.  On November 04, 2008, the patient was tolerating a  regular diet.  He was ambulatory with bowel function and plans were made  for discharge.   DISCHARGE INSTRUCTIONS:  The patient was instructed to resume normal  activities.  He is not to lift anything greater than 20  pounds for the  next 3 weeks.  He is to resume a normal diet.  He may shower, but he is  not to soak his incision for the next 3 weeks.  He may remove the  adhesive strips in the next 1-2 weeks.  He was given a prescription for  narcotic pain medications.  He is to return to see me in 3 weeks.   DISCHARGE MEDICATIONS:  The patient is to continue all previously  prescribed home medications.  Additionally, the patient was given a  prescription for Lortab 5/500 one to two p.o. q.4 hours p.r.n. pain.      Tilford Pillar, MD  Electronically Signed     BZ/MEDQ  D:  11/19/2008  T:  11/20/2008  Job:  [id number]

## 2012-10-06 ENCOUNTER — Emergency Department (HOSPITAL_BASED_OUTPATIENT_CLINIC_OR_DEPARTMENT_OTHER)
Admission: EM | Admit: 2012-10-06 | Discharge: 2012-10-06 | Disposition: A | Discharge status: Home / Self Care | Payer: BC Managed Care – PPO | Attending: Emergency Medicine | Admitting: Emergency Medicine

## 2012-10-06 ENCOUNTER — Encounter (HOSPITAL_BASED_OUTPATIENT_CLINIC_OR_DEPARTMENT_OTHER): Payer: Self-pay | Admitting: Emergency Medicine

## 2012-10-06 ENCOUNTER — Emergency Department (HOSPITAL_BASED_OUTPATIENT_CLINIC_OR_DEPARTMENT_OTHER): Payer: BC Managed Care – PPO

## 2012-10-06 DIAGNOSIS — R6889 Other general symptoms and signs: Secondary | ICD-10-CM | POA: Insufficient documentation

## 2012-10-06 DIAGNOSIS — N200 Calculus of kidney: Secondary | ICD-10-CM

## 2012-10-06 LAB — BASIC METABOLIC PANEL
CO2: 30 mEq/L (ref 19–32)
GFR calc non Af Amer: 78 mL/min — ABNORMAL LOW (ref >90)
Glucose, Bld: 96 mg/dL (ref 70–99)
Potassium: 4.1 mEq/L (ref 3.5–5.1)
Sodium: 140 mEq/L (ref 135–145)

## 2012-10-06 LAB — CBC WITH DIFFERENTIAL/PLATELET
Lymphocytes Relative: 19 % (ref 12–46)
Lymphs Abs: 1.6 10*3/uL (ref 0.7–4.0)
Neutro Abs: 6 10*3/uL (ref 1.7–7.7)
Neutrophils Relative %: 70 % (ref 43–77)
Platelets: 205 10*3/uL (ref 150–400)
RBC: 4.77 MIL/uL (ref 4.22–5.81)
WBC: 8.5 10*3/uL (ref 4.0–10.5)

## 2012-10-06 LAB — URINALYSIS, ROUTINE W REFLEX MICROSCOPIC
Leukocytes, UA: NEGATIVE
Nitrite: NEGATIVE
Specific Gravity, Urine: 1.042 — ABNORMAL HIGH (ref 1.005–1.030)
Urobilinogen, UA: 1 mg/dL (ref 0.0–1.0)

## 2012-10-06 LAB — URINE MICROSCOPIC-ADD ON

## 2012-10-06 MED ORDER — SODIUM CHLORIDE 0.9 % IV BOLUS (SEPSIS)
1000.0000 mL | Freq: Once | INTRAVENOUS | Status: AC
  Administered 2012-10-06: 1000 mL via INTRAVENOUS

## 2012-10-06 MED ORDER — MORPHINE SULFATE 4 MG/ML IJ SOLN
4.0000 mg | Freq: Once | INTRAMUSCULAR | Status: AC
  Administered 2012-10-06: 4 mg via INTRAVENOUS

## 2012-10-06 MED ORDER — KETOROLAC TROMETHAMINE 30 MG/ML IJ SOLN
30.0000 mg | Freq: Once | INTRAMUSCULAR | Status: AC
  Administered 2012-10-06: 30 mg via INTRAVENOUS
  Filled 2012-10-06 (×2): qty 1

## 2012-10-06 MED ORDER — ONDANSETRON HCL 4 MG/2ML IJ SOLN
INTRAMUSCULAR | Status: AC
  Administered 2012-10-06: 4 mg via INTRAVENOUS
  Filled 2012-10-06: qty 2

## 2012-10-06 MED ORDER — ONDANSETRON HCL 4 MG/2ML IJ SOLN
4.0000 mg | Freq: Once | INTRAMUSCULAR | Status: AC
  Administered 2012-10-06: 4 mg via INTRAVENOUS

## 2012-10-06 MED ORDER — OXYCODONE-ACETAMINOPHEN 5-325 MG PO TABS: 2.0000 | ORAL_TABLET | ORAL | Status: DC | PRN

## 2012-10-06 MED ORDER — HYDROMORPHONE HCL PF 1 MG/ML IJ SOLN: 1.0000 mg | Freq: Once | INTRAMUSCULAR | Status: DC

## 2012-10-06 MED ORDER — TAMSULOSIN HCL 0.4 MG PO CAPS
0.4000 mg | ORAL_CAPSULE | Freq: Once | ORAL | Status: AC
  Administered 2012-10-06: 0.4 mg via ORAL
  Filled 2012-10-06: qty 1

## 2012-10-06 MED ORDER — MORPHINE SULFATE 4 MG/ML IJ SOLN
INTRAMUSCULAR | Status: AC
  Administered 2012-10-06: 4 mg via INTRAVENOUS
  Filled 2012-10-06: qty 1

## 2012-10-06 NOTE — ED Notes (Signed)
Pt having right sided groin pain, history of kidney stones.  Pt restless in chair.

## 2012-10-06 NOTE — ED Provider Notes (Signed)
Medical screening examination/treatment/procedure(s) were conducted as a shared visit with non-physician practitioner(s) and myself.  I personally evaluated the patient during the encounter  Derwood Kaplan, MD 10/06/12 2352

## 2012-10-06 NOTE — ED Provider Notes (Signed)
History     CSN: [id number]  Arrival date & time 10/06/12  1819   First MD Initiated Contact with Patient 10/06/12 1830      Chief Complaint  Patient presents with  . Groin Pain    (Consider location/radiation/quality/duration/timing/severity/associated sxs/prior treatment) HPI Comments: Patient is a 47 year old male who presents with right groin pain that started yesterday. The pain started gradually and progressively worsened since the onset. The pain is sharp and severe without radiation. Patient reports some lower abdominal pain last night. No aggravating/alleviating factors. Patient has not tried anything for symptoms. No associated symptoms. Patient has a history of kidney stones and he reports this feels exactly like his previous kidney stones.   History reviewed. No pertinent past medical history.  History reviewed. No pertinent past surgical history.  History reviewed. No pertinent family history.  History  Substance Use Topics  . Smoking status: Not on file  . Smokeless tobacco: Not on file  . Alcohol Use: Not on file      Review of Systems  Gastrointestinal: Positive for abdominal pain.  Genitourinary: Positive for decreased urine volume.  All other systems reviewed and are negative.    Allergies  Review of patient's allergies indicates no known allergies.  Home Medications  No current outpatient prescriptions on file.  BP 170/110  Pulse 73  Temp(Src) 98 F (36.7 C) (Oral)  Resp 16  SpO2 98%  Physical Exam  Nursing note and vitals reviewed. Constitutional: He is oriented to person, place, and time. He appears well-developed and well-nourished. No distress.  HENT:  Head: Normocephalic and atraumatic.  Eyes: Conjunctivae and EOM are normal.  Neck: Normal range of motion. Neck supple.  Cardiovascular: Normal rate and regular rhythm.  Exam reveals no gallop and no friction rub.   No murmur heard. Pulmonary/Chest: Effort normal and breath sounds  normal. He has no wheezes. He has no rales. He exhibits no tenderness.  Abdominal: Soft. He exhibits no distension. There is no tenderness. There is no rebound and no guarding.  Genitourinary:  Groin nontender to palpation.   Musculoskeletal: Normal range of motion.  Neurological: He is alert and oriented to person, place, and time. Coordination normal.  Speech is goal-oriented. Moves limbs without ataxia.   Skin: Skin is warm and dry.  Psychiatric: He has a normal mood and affect. His behavior is normal.    ED Course  Procedures (including critical care time)  Labs Reviewed  URINALYSIS, ROUTINE W REFLEX MICROSCOPIC - Abnormal; Notable for the following:    Color, Urine AMBER (*)    Specific Gravity, Urine 1.042 (*)    Hgb urine dipstick LARGE (*)    Bilirubin Urine SMALL (*)    All other components within normal limits  CBC WITH DIFFERENTIAL - Abnormal; Notable for the following:    MCHC 36.6 (*)    All other components within normal limits  BASIC METABOLIC PANEL - Abnormal; Notable for the following:    GFR calc non Af Amer 78 (*)    All other components within normal limits  URINE MICROSCOPIC-ADD ON - Abnormal; Notable for the following:    Bacteria, UA FEW (*)    All other components within normal limits   Ct Abdomen Pelvis Wo Contrast  10/06/2012  *RADIOLOGY REPORT*  Clinical Data: Right-sided flank and groin pain.  CT ABDOMEN AND PELVIS WITHOUT CONTRAST  Technique:  Multidetector CT imaging of the abdomen and pelvis was performed following the standard protocol without intravenous contrast.  Comparison: 08/06/2009  Findings: Lung bases are clear.  Visualized portions of the liver are normal.  There are multiple small calcified gallstones layering dependently in the gallbladder.  I do not see evidence of a ductal stone.  The spleen is normal.  The pancreas is normal without contrast.  The adrenal glands are normal.  The left kidney contains a 1 cm cyst projecting ventrally from  the upper pole.  No stone or hydronephrosis on the left.  On the right, the kidney is mildly swollen and there is mild fullness of the collecting system and right ureter.  Posteriorly within the bladder, there is a 3 mm stone that has entered into the bladder.  There is a dilated loop of small intestine in the upper abdomen that could relate to an ileus.  No other bowel finding.  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  IMPRESSION: 3 mm stone has passed into the bladder.  Mild residual fullness of the right renal collecting system and ureter.  No other urinary tract calculi.  Dilated loop of small intestine, possibly related to a mild ileus secondary to recent pain.   Original Report Authenticated By: Paulina Fusi, M.D.      1. Kidney stone       MDM  8:02 PM Urinalysis shows hemoglobin. Patient will have CT without contrast to rule out kidney stone. Patient given fluids, morphine and zofran.   8:43 PM CT scan shows a 3mm stone that has dropped into the bladder. Labs unremarkable. Patient will have Toradol and Flomax. Patient will be discharged home with pain medication and flomax. Patient instructed to return to the ED with worsening or concerning symptoms.   9:05 PM Patient reports relief of pain and is able to urinate now. Patient thinks he passed the stone and is ready to go home. Vitals stable. No further evaluation needed at this time.     Emilia Beck, [place] 10/06/12 2108

## 2014-07-17 ENCOUNTER — Other Ambulatory Visit: Payer: Self-pay | Admitting: Dermatology

## 2016-05-20 ENCOUNTER — Emergency Department (HOSPITAL_COMMUNITY)
Admission: EM | Admit: 2016-05-20 | Discharge: 2016-05-20 | Disposition: A | Discharge status: Home / Self Care | Payer: BLUE CROSS/BLUE SHIELD | Attending: Emergency Medicine | Admitting: Emergency Medicine

## 2016-05-20 ENCOUNTER — Encounter (HOSPITAL_COMMUNITY): Payer: Self-pay | Admitting: Emergency Medicine

## 2016-05-20 DIAGNOSIS — R1032 Left lower quadrant pain: Secondary | ICD-10-CM | POA: Insufficient documentation

## 2016-05-20 DIAGNOSIS — Z791 Long term (current) use of non-steroidal anti-inflammatories (NSAID): Secondary | ICD-10-CM | POA: Insufficient documentation

## 2016-05-20 DIAGNOSIS — Z79899 Other long term (current) drug therapy: Secondary | ICD-10-CM | POA: Insufficient documentation

## 2016-05-20 DIAGNOSIS — R109 Unspecified abdominal pain: Secondary | ICD-10-CM

## 2016-05-20 HISTORY — DX: Calculus of kidney: N20.0

## 2016-05-20 LAB — URINE MICROSCOPIC-ADD ON
Bacteria, UA: NONE SEEN
Squamous Epithelial / LPF: NONE SEEN
WBC, UA: NONE SEEN WBC/hpf (ref 0–5)

## 2016-05-20 LAB — BASIC METABOLIC PANEL
Anion gap: 8 (ref 5–15)
BUN: 18 mg/dL (ref 6–20)
CO2: 23 mmol/L (ref 22–32)
Calcium: 8.9 mg/dL (ref 8.9–10.3)
Chloride: 104 mmol/L (ref 101–111)
Creatinine, Ser: 0.88 mg/dL (ref 0.61–1.24)
GFR calc Af Amer: >60 mL/min (ref >60)
GFR calc non Af Amer: >60 mL/min (ref >60)
Glucose, Bld: 109 mg/dL — ABNORMAL HIGH (ref 65–99)
Potassium: 3.9 mmol/L (ref 3.5–5.1)
Sodium: 135 mmol/L (ref 135–145)

## 2016-05-20 LAB — CBC
HCT: 45 % (ref 39.0–52.0)
Hemoglobin: 16.3 g/dL (ref 13.0–17.0)
MCH: 33.5 pg (ref 26.0–34.0)
MCHC: 36.2 g/dL — ABNORMAL HIGH (ref 30.0–36.0)
MCV: 92.4 fL (ref 78.0–100.0)
Platelets: 169 10*3/uL (ref 150–400)
RBC: 4.87 MIL/uL (ref 4.22–5.81)
RDW: 11.8 % (ref 11.5–15.5)
WBC: 9.7 10*3/uL (ref 4.0–10.5)

## 2016-05-20 LAB — URINALYSIS, ROUTINE W REFLEX MICROSCOPIC
Bilirubin Urine: NEGATIVE
Glucose, UA: NEGATIVE mg/dL
Ketones, ur: NEGATIVE mg/dL
Leukocytes, UA: NEGATIVE
Nitrite: NEGATIVE
Protein, ur: NEGATIVE mg/dL
Specific Gravity, Urine: <1.005 — ABNORMAL LOW (ref 1.005–1.030)
pH: 7.5 (ref 5.0–8.0)

## 2016-05-20 MED ORDER — TAMSULOSIN HCL 0.4 MG PO CAPS
0.4000 mg | ORAL_CAPSULE | Freq: Once | ORAL | Status: AC
  Administered 2016-05-20: 0.4 mg via ORAL
  Filled 2016-05-20: qty 1

## 2016-05-20 MED ORDER — TAMSULOSIN HCL 0.4 MG PO CAPS: 0.4000 mg | ORAL_CAPSULE | Freq: Every day | ORAL | 0 refills | Status: AC

## 2016-05-20 MED ORDER — ONDANSETRON HCL 4 MG PO TABS
4.0000 mg | ORAL_TABLET | Freq: Four times a day (QID) | ORAL | 0 refills | Status: AC
Start: 2016-05-20 — End: ?

## 2016-05-20 MED ORDER — KETOROLAC TROMETHAMINE 30 MG/ML IJ SOLN
15.0000 mg | Freq: Once | INTRAMUSCULAR | Status: AC
  Administered 2016-05-20: 15 mg via INTRAVENOUS
  Filled 2016-05-20: qty 1

## 2016-05-20 MED ORDER — ONDANSETRON HCL 4 MG/2ML IJ SOLN
4.0000 mg | Freq: Once | INTRAMUSCULAR | Status: AC
  Administered 2016-05-20: 4 mg via INTRAVENOUS
  Filled 2016-05-20: qty 2

## 2016-05-20 MED ORDER — SODIUM CHLORIDE 0.9 % IV BOLUS (SEPSIS)
1000.0000 mL | Freq: Once | INTRAVENOUS | Status: AC
  Administered 2016-05-20: 1000 mL via INTRAVENOUS

## 2016-05-20 MED ORDER — OXYCODONE-ACETAMINOPHEN 5-325 MG PO TABS
1.0000 | ORAL_TABLET | ORAL | 0 refills | Status: AC | PRN
Start: 2016-05-20 — End: ?

## 2016-05-20 MED ORDER — HYDROMORPHONE HCL 1 MG/ML IJ SOLN
1.0000 mg | Freq: Once | INTRAMUSCULAR | Status: AC
  Administered 2016-05-20: 1 mg via INTRAVENOUS
  Filled 2016-05-20: qty 1

## 2016-05-20 NOTE — ED Triage Notes (Signed)
PT c/o left groin pain starting at 0600 this am and states hx of kidney stones and states had urinary retention this am but was able to emptying his bladder in the waiting room here in the ED. PT also c/o diarrhea x1 this am and nausea but no vomiting.

## 2016-05-20 NOTE — ED Notes (Signed)
Pt made aware to return if symptoms worsen or if any life threatening symptoms occur.   

## 2016-05-23 NOTE — ED Provider Notes (Signed)
[facility]-EMERGENCY DEPT Provider Note   CSN: [id number] Arrival date & time: 05/20/16  0901     History   Chief Complaint Chief Complaint  Patient presents with  . Groin Pain    HPI Albert Mcbride is a 50 y.o. male.  HPI   50 year old male with left flank and left groin pain.  Acute onset about 6 AM this morning.  Pain has been pretty persistent and severe since then.  Waxes and wanes, but does not completely go away.  Her sharp in nature.  He has a past history of multiple previous kidney stones.  States that current symptoms feel similar.  Severe nausea, but no vomiting.  He has a sensation that he needs to void was only able to do a small amount tries.  He denies ever needing any intervention for his first stones.  Past Medical History:  Diagnosis Date  . Kidney stones     There are no active problems to display for this patient.   Past Surgical History:  Procedure Laterality Date  . APPENDECTOMY         Home Medications    Prior to Admission medications   Medication Sig Start Date End Date Taking? Authorizing Provider  ibuprofen (ADVIL,MOTRIN) 200 MG tablet Take 200 mg by mouth every 6 (six) hours as needed for moderate pain.   Yes Historical Provider, MD  OVER THE COUNTER MEDICATION Take 2 capsules by mouth daily. Hydrangea Root 370 mg capsule   Yes Historical Provider, MD  ondansetron (ZOFRAN) 4 MG tablet Take 1 tablet (4 mg total) by mouth every 6 (six) hours. 05/20/16   Raeford RazorStephen Merry [place]ond, MD  oxyCODONE-acetaminophen (PERCOCET/ROXICET) 5-325 MG tablet Take 1-2 tablets by mouth every 4 (four) hours as needed for severe pain. 05/20/16   Raeford RazorStephen Antoinette Borgwardt, MD  tamsulosin (FLOMAX) 0.4 MG CAPS capsule Take 1 capsule (0.4 mg total) by mouth daily. 05/20/16   Raeford RazorStephen Fitzpatrick Alberico, MD    Family History History reviewed. No pertinent family history.  Social History Social History  Substance Use Topics  . Smoking status: Never Smoker  . Smokeless tobacco: Never Used  . Alcohol use  Yes     Comment: occassionally     Allergies   Penicillins   Review of Systems Review of Systems  All systems reviewed and negative, other than as noted in HPI.   Physical Exam Updated Vital Signs BP 114/67   Pulse 87   Temp 98.1 F (36.7 C)   Resp 18   Ht 5\' 9"  (1.753 m)   Wt 185 lb (83.9 kg)   SpO2 96%   BMI 27.32 kg/m   Physical Exam  Constitutional: He appears well-developed and well-nourished. He appears distressed.  Laying on side.  Appears uncomfortable.  HENT:  Head: Normocephalic and atraumatic.  Eyes: Conjunctivae are normal. Right eye exhibits no discharge. Left eye exhibits no discharge.  Neck: Neck supple.  Cardiovascular: Normal rate, regular rhythm and normal heart sounds.  Exam reveals no gallop and no friction rub.   No murmur heard. Pulmonary/Chest: Effort normal and breath sounds normal. No respiratory distress.  Abdominal: Soft. He exhibits no distension. There is no tenderness.  Musculoskeletal: He exhibits no edema or tenderness.  Neurological: He is alert.  Skin: Skin is warm. He is diaphoretic.  Psychiatric: He has a normal mood and affect. His behavior is normal. Thought content normal.  Nursing note and vitals reviewed.    ED Treatments / Results  Labs (all labs ordered are listed, but  only abnormal results are displayed) Labs Reviewed  URINALYSIS, ROUTINE W REFLEX MICROSCOPIC (NOT AT [facility]) - Abnormal; Notable for the following:       Result Value   Specific Gravity, Urine <1.005 (*)    Hgb urine dipstick LARGE (*)    All other components within normal limits  CBC - Abnormal; Notable for the following:    MCHC 36.2 (*)    All other components within normal limits  BASIC METABOLIC PANEL - Abnormal; Notable for the following:    Glucose, Bld 109 (*)    All other components within normal limits  URINE MICROSCOPIC-ADD ON    EKG  EKG Interpretation None       Radiology No results found.  Procedures Procedures (including  critical care time)  Medications Ordered in ED Medications  HYDROmorphone (DILAUDID) injection 1 mg (1 mg Intravenous Given 05/20/16 0941)  tamsulosin (FLOMAX) capsule 0.4 mg (0.4 mg Oral Given 05/20/16 0941)  ketorolac (TORADOL) 30 MG/ML injection 15 mg (15 mg Intravenous Given 05/20/16 0952)  sodium chloride 0.9 % bolus 1,000 mL (0 mLs Intravenous Stopped 05/20/16 1135)  ondansetron (ZOFRAN) injection 4 mg (4 mg Intravenous Given 05/20/16 0941)     Initial Impression / Assessment and Plan / ED Course  I have reviewed the triage vital signs and the nursing notes.  Pertinent labs & imaging results that were available during my care of the patient were reviewed by me and considered in my medical decision making (see chart for details).  Clinical Course   50 year old male with severe left flank pain.  Symptoms consistent with ureteral colic.  Past history of the same.  There is blood on his urinalysis consistent with a kidney stone.  Renal function is fine.  He is afebrile.  He is now reasonably comfortable.  Plan continued symptom treatment, expected management.  Return precautions were discussed.  Urology follow-up as needed otherwise.  Final Clinical Impressions(s) / ED Diagnoses   Final diagnoses:  Left flank pain    New Prescriptions Discharge Medication List as of 05/20/2016 11:26 AM    START taking these medications   Details  ondansetron (ZOFRAN) 4 MG tablet Take 1 tablet (4 mg total) by mouth every 6 (six) hours., Starting Fri 05/20/2016, Print    oxyCODONE-acetaminophen (PERCOCET/ROXICET) 5-325 MG tablet Take 1-2 tablets by mouth every 4 (four) hours as needed for severe pain., Starting Fri 05/20/2016, Print    tamsulosin (FLOMAX) 0.4 MG CAPS capsule Take 1 capsule (0.4 mg total) by mouth daily., Starting Fri 05/20/2016, Print         Raeford Razor, MD 05/23/16 1601
